# Patient Record
Sex: Male | Born: 1965 | Race: Black or African American | Hispanic: No | Marital: Single | Smoking: Former smoker
Health system: Southern US, Community
[De-identification: ages and names within clinical notes are randomized; demographics above are authoritative.]

## PROBLEM LIST (undated history)

## (undated) ENCOUNTER — Ambulatory Visit (HOSPITAL_COMMUNITY): Payer: No Typology Code available for payment source

## (undated) DIAGNOSIS — I10 Essential (primary) hypertension: Secondary | ICD-10-CM

---

## 2014-07-07 ENCOUNTER — Encounter: Payer: Self-pay | Admitting: *Deleted

## 2018-01-10 ENCOUNTER — Emergency Department (HOSPITAL_COMMUNITY): Payer: Self-pay

## 2018-01-10 ENCOUNTER — Encounter (HOSPITAL_COMMUNITY): Payer: Self-pay | Admitting: Emergency Medicine

## 2018-01-10 ENCOUNTER — Emergency Department (HOSPITAL_COMMUNITY)
Admission: EM | Admit: 2018-01-10 | Discharge: 2018-01-10 | Disposition: A | Discharge status: Home / Self Care | Payer: Self-pay | Attending: Emergency Medicine | Admitting: Emergency Medicine

## 2018-01-10 ENCOUNTER — Other Ambulatory Visit: Payer: Self-pay

## 2018-01-10 DIAGNOSIS — R0789 Other chest pain: Secondary | ICD-10-CM | POA: Insufficient documentation

## 2018-01-10 DIAGNOSIS — J918 Pleural effusion in other conditions classified elsewhere: Secondary | ICD-10-CM | POA: Insufficient documentation

## 2018-01-10 DIAGNOSIS — J90 Pleural effusion, not elsewhere classified: Secondary | ICD-10-CM

## 2018-01-10 DIAGNOSIS — Z87891 Personal history of nicotine dependence: Secondary | ICD-10-CM | POA: Insufficient documentation

## 2018-01-10 LAB — COMPREHENSIVE METABOLIC PANEL
ALK PHOS: 82 U/L (ref 38–126)
ALT: 18 U/L (ref 17–63)
AST: 20 U/L (ref 15–41)
Albumin: 4 g/dL (ref 3.5–5.0)
Anion gap: 13 (ref 5–15)
BILIRUBIN TOTAL: 1.3 mg/dL — AB (ref 0.3–1.2)
BUN: 11 mg/dL (ref 6–20)
CALCIUM: 9.2 mg/dL (ref 8.9–10.3)
CO2: 22 mmol/L (ref 22–32)
CREATININE: 1.03 mg/dL (ref 0.61–1.24)
Chloride: 100 mmol/L — ABNORMAL LOW (ref 101–111)
GFR calc non Af Amer: >60 mL/min (ref >60)
Glucose, Bld: 127 mg/dL — ABNORMAL HIGH (ref 65–99)
Potassium: 3.4 mmol/L — ABNORMAL LOW (ref 3.5–5.1)
Sodium: 135 mmol/L (ref 135–145)
TOTAL PROTEIN: 7.8 g/dL (ref 6.5–8.1)

## 2018-01-10 LAB — CBG MONITORING, ED: Glucose-Capillary: 124 mg/dL — ABNORMAL HIGH (ref 65–99)

## 2018-01-10 LAB — CBC WITH DIFFERENTIAL/PLATELET
BASOS PCT: 0 %
Basophils Absolute: 0 10*3/uL (ref 0.0–0.1)
EOS PCT: 1 %
Eosinophils Absolute: 0.2 10*3/uL (ref 0.0–0.7)
HCT: 44.5 % (ref 39.0–52.0)
Hemoglobin: 15.2 g/dL (ref 13.0–17.0)
LYMPHS ABS: 2.8 10*3/uL (ref 0.7–4.0)
Lymphocytes Relative: 17 %
MCH: 31.3 pg (ref 26.0–34.0)
MCHC: 34.2 g/dL (ref 30.0–36.0)
MCV: 91.6 fL (ref 78.0–100.0)
MONO ABS: 1.5 10*3/uL — AB (ref 0.1–1.0)
Monocytes Relative: 9 %
NEUTROS ABS: 11.8 10*3/uL — AB (ref 1.7–7.7)
NEUTROS PCT: 73 %
PLATELETS: 224 10*3/uL (ref 150–400)
RBC: 4.86 MIL/uL (ref 4.22–5.81)
RDW: 13.6 % (ref 11.5–15.5)
WBC: 16.3 10*3/uL — ABNORMAL HIGH (ref 4.0–10.5)

## 2018-01-10 LAB — TROPONIN I

## 2018-01-10 LAB — MAGNESIUM: MAGNESIUM: 1.9 mg/dL (ref 1.7–2.4)

## 2018-01-10 LAB — PROTIME-INR
INR: 1.08
Prothrombin Time: 13.9 seconds (ref 11.4–15.2)

## 2018-01-10 LAB — BRAIN NATRIURETIC PEPTIDE: B Natriuretic Peptide: 31.5 pg/mL (ref 0.0–100.0)

## 2018-01-10 MED ORDER — TRAMADOL HCL 50 MG PO TABS: 50.0000 mg | ORAL_TABLET | Freq: Four times a day (QID) | ORAL | 0 refills | Status: AC | PRN

## 2018-01-10 MED ORDER — KETOROLAC TROMETHAMINE 30 MG/ML IJ SOLN
30.0000 mg | Freq: Once | INTRAMUSCULAR | Status: AC
  Administered 2018-01-10: 30 mg via INTRAVENOUS
  Filled 2018-01-10: qty 1

## 2018-01-10 MED ORDER — MORPHINE SULFATE (PF) 4 MG/ML IV SOLN
4.0000 mg | Freq: Once | INTRAVENOUS | Status: DC
  Filled 2018-01-10: qty 1

## 2018-01-10 MED ORDER — PREDNISONE 20 MG PO TABS
60.0000 mg | ORAL_TABLET | ORAL | Status: AC
  Administered 2018-01-10: 60 mg via ORAL
  Filled 2018-01-10: qty 3

## 2018-01-10 MED ORDER — PREDNISONE 20 MG PO TABS: 40.0000 mg | ORAL_TABLET | Freq: Every day | ORAL | 0 refills | Status: AC

## 2018-01-10 MED ORDER — ASPIRIN 81 MG PO CHEW
324.0000 mg | CHEWABLE_TABLET | Freq: Once | ORAL | Status: AC
  Administered 2018-01-10: 324 mg via ORAL
  Filled 2018-01-10: qty 4

## 2018-01-10 NOTE — ED Notes (Signed)
ED Provider at bedside. 

## 2018-01-10 NOTE — ED Provider Notes (Signed)
[facility]   CSN: [id number] Arrival date & time: 01/10/18  [id number]     History   Chief Complaint Chief Complaint  Patient presents with  . Chest Pain    HPI Justin Mcpherson is a 52 y.o. male.  HPI Patient presents with concern of chest pain. Pain is left upper chest, left posterior thorax. Pain began 4 days ago, after he had an episode of nausea, vomiting. He recalls eating chicken that he has some concern about prior to the onset of nausea, but otherwise no recent changes in medication, diet, activity. Nausea has essentially resolved, but the pain is been persistent since onset, waxing, waning severity. It is not positional, exertional, pleuritic. No syncope, no fever, no chills. There is some left upper abdominal pain as well. Patient notes that his appetite is diminished, though he cannot specify if this is due to nausea or pain.  Patient denies history of substantial medical problems, has no history of cardiac disease, nor cardiac evaluation.  Patient is a former smoker.   Past medical history: Unremarkable  Past surgical history: None    Home Medications     Allergies   Patient has no known allergies.   Review of Systems Review of Systems  Constitutional:       Per HPI, otherwise negative  HENT:       Per HPI, otherwise negative  Respiratory:       Per HPI, otherwise negative  Cardiovascular:       Per HPI, otherwise negative  Gastrointestinal: Positive for abdominal pain, nausea and vomiting.  Endocrine:       Negative aside from HPI  Genitourinary:       Neg aside from HPI   Musculoskeletal:       Per HPI, otherwise negative  Skin: Negative.   Neurological: Negative for syncope.     Physical Exam Updated Vital Signs BP (!) 159/91   Pulse 60   Resp 19   SpO2 97%   Physical Exam  Constitutional: He is oriented to person, place, and time. He appears well-developed. No distress.  HENT:    Head: Normocephalic and atraumatic.  Eyes: Conjunctivae and EOM are normal.  Cardiovascular: Normal rate and regular rhythm.  Pulmonary/Chest: Effort normal. No stridor. No respiratory distress.  Mild tenderness to palpation left upper chest  Abdominal: He exhibits no distension.  Minimal discomfort with palpation about the epigastrium  Musculoskeletal: He exhibits no edema.  Neurological: He is alert and oriented to person, place, and time.  Skin: Skin is warm and dry.  Psychiatric: He has a normal mood and affect.  Nursing note and vitals reviewed.    ED Treatments / Results  Labs (all labs ordered are listed, but only abnormal results are displayed) Labs Reviewed  COMPREHENSIVE METABOLIC PANEL - Abnormal; Notable for the following components:      Result Value   Potassium 3.4 (*)    Chloride 100 (*)    Glucose, Bld 127 (*)    Total Bilirubin 1.3 (*)    All other components within normal limits  CBC WITH DIFFERENTIAL/PLATELET - Abnormal; Notable for the following components:   WBC 16.3 (*)    Neutro Abs 11.8 (*)    Monocytes Absolute 1.5 (*)    All other components within normal limits  CBG MONITORING, ED - Abnormal; Notable for the following components:   Glucose-Capillary 124 (*)    All other components within normal limits  MAGNESIUM  TROPONIN  I  BRAIN NATRIURETIC PEPTIDE  PROTIME-INR    EKG EKG Interpretation  Date/Time:  Tuesday January 10 2018 06:58:52 EDT Ventricular Rate:  87 PR Interval:    QRS Duration: 89 QT Interval:  368 QTC Calculation: 443 R Axis:   77 Text Interpretation:  Sinus rhythm Probable left atrial enlargement Probable left ventricular hypertrophy Abnormal T, consider ischemia, diffuse leads Confirmed by Nicanor Alcon, April ([id number]) on 01/10/2018 7:01:51 AM   Radiology Ct Chest Wo Contrast  Result Date: 01/10/2018 CLINICAL DATA:  Central chest pain and shortness of breath beginning 3 days ago after episode food poisoning with vomiting. EXAM: CT  CHEST WITHOUT CONTRAST TECHNIQUE: Multidetector CT imaging of the chest was performed following the standard protocol without IV contrast. COMPARISON:  Chest radiography same day. FINDINGS: Cardiovascular: Heart size upper limits of normal or mildly enlarged. Mild atherosclerotic calcification of the aortic arch. Minimal coronary artery calcification. No pericardial fluid. Mediastinum/Nodes: No visible mediastinal or hilar mass or lymphadenopathy. Normal appearing mediastinal nodes. Lungs/Pleura: Small left effusion layering dependently with mild dependent atelectasis. Upper Abdomen: Negative Musculoskeletal: Negative. Some degree of thyroid goiter. IMPRESSION: Small left effusion layering dependently with mild dependent pulmonary atelectasis. No mediastinal air or gas. Mild cardiomegaly. Mild aortic atherosclerosis. Minimal coronary artery calcification. Electronically Signed   By: Paulina Fusi M.D.   On: 01/10/2018 10:08   Dg Chest Portable 1 View  Result Date: 01/10/2018 CLINICAL DATA:  Chest pain beginning 3 days ago. Possible food poisoning with vomiting. EXAM: PORTABLE CHEST 1 VIEW COMPARISON:  None. FINDINGS: Heart size is normal. Mild tortuosity of the aorta. Mediastinal shadows otherwise normal. No mediastinal air. The lungs are clear. The vascularity is normal. No effusions. No abnormalities. IMPRESSION: No active disease. No sign of pneumomediastinum or other acute chest pathology. Electronically Signed   By: Paulina Fusi M.D.   On: 01/10/2018 07:27    Procedures Procedures (including critical care time)  Medications Ordered in ED Medications  morphine 4 MG/ML injection 4 mg (has no administration in time range)  aspirin chewable tablet 324 mg (324 mg Oral Given 01/10/18 0729)  ketorolac (TORADOL) 30 MG/ML injection 30 mg (30 mg Intravenous Given 01/10/18 0825)     Initial Impression / Assessment and Plan / ED Course  I have reviewed the triage vital signs and the nursing notes.  Pertinent  labs & imaging results that were available during my care of the patient were reviewed by me and considered in my medical decision making (see chart for details).   Repeat exam patient continues to complain of pain. Initial x-ray reassuring, labs reassuring  10:53 AM I demonstrated the CT images to the patient and we discussed the left-sided pleural effusion. We discussed possibilities for this, including inflammation, prior infection, malignancy. I offered admission for further evaluation and management versus close outpatient follow-up with pulmonology. Patient had a strong preference for this latter. With no evidence for ACS, no evidence for PE, with no hypoxia, tachypnea, low risk profile, no evidence for pneumonia, bacteremia, sepsis, the patient is appropriate for close outpatient follow-up.  Final Clinical Impressions(s) / ED Diagnoses  Atypical chest pain Pleural effusion, left   Gerhard Munch, MD 01/10/18 1054

## 2018-01-10 NOTE — ED Notes (Signed)
Patient transported to CT 

## 2018-01-10 NOTE — ED Triage Notes (Signed)
Pt reports chest pain that started on Saturday after eating chicken he thinks made him sick. Pt reports not being able to sleep last two days d/t pain. Pt endorses vomiting Saturday night as well.

## 2018-10-09 IMAGING — CT CT CHEST W/O CM
2 of 3 series · 15 of 36 positions shown, 18 images · non-contrast
Comparison: Chest radiography same day.

CLINICAL DATA: Central chest pain and shortness of breath beginning
3 days ago after episode food poisoning with vomiting.

EXAM:
CT CHEST WITHOUT CONTRAST
TECHNIQUE: Multidetector CT imaging of the chest was performed following the
standard protocol without IV contrast.

[Series 4: thorax 2.0 · axial · 0.82mm/px · z∈[+1024,+1344]mm · 12 of 188 slices shown, 15 images]
[im 14/188  mediastinal]
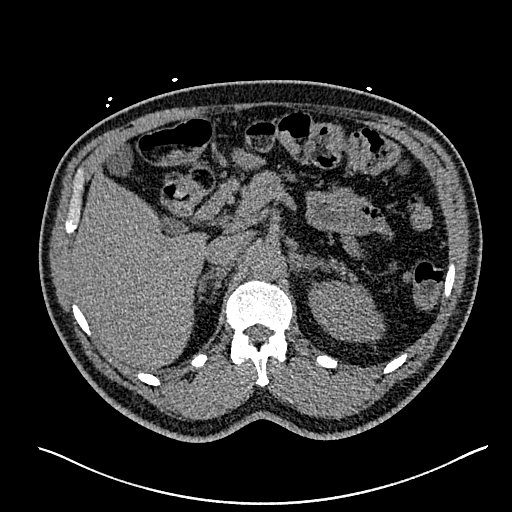
[im 14/188  lung]
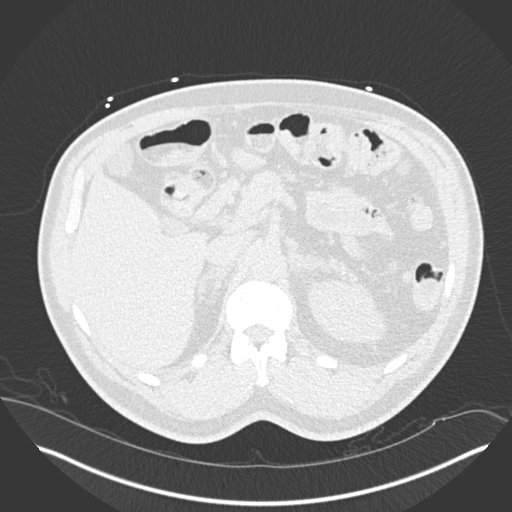
[im 28/188  lung]
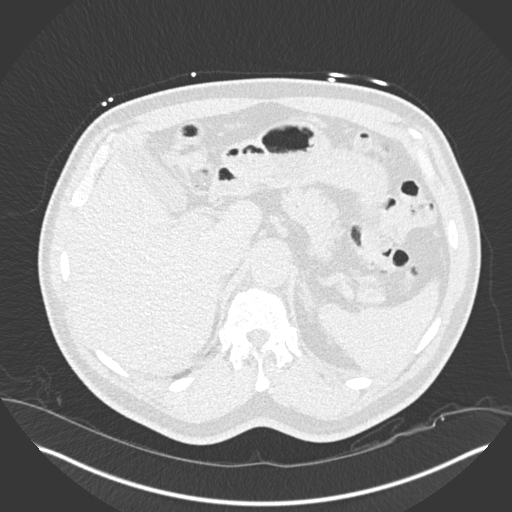
[im 42/188  lung]
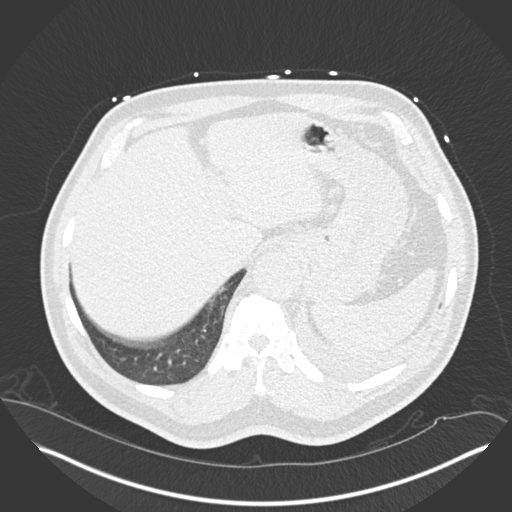
[im 56/188  lung]
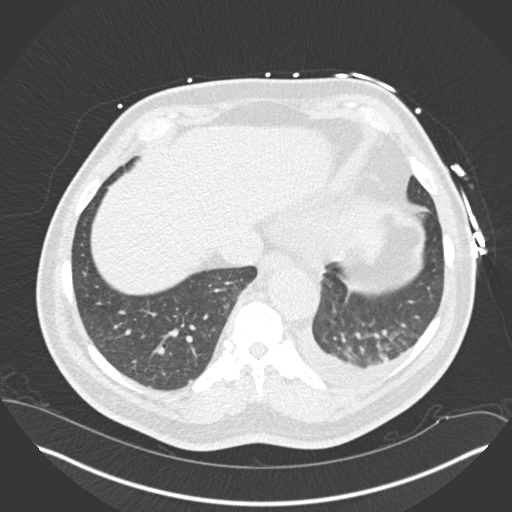
[im 70/188  mediastinal]
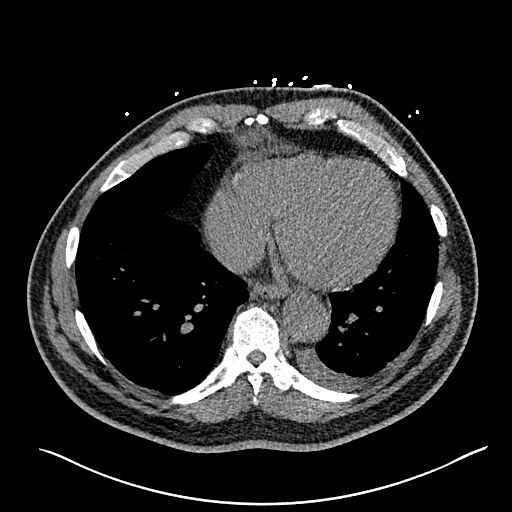
[im 70/188  lung]
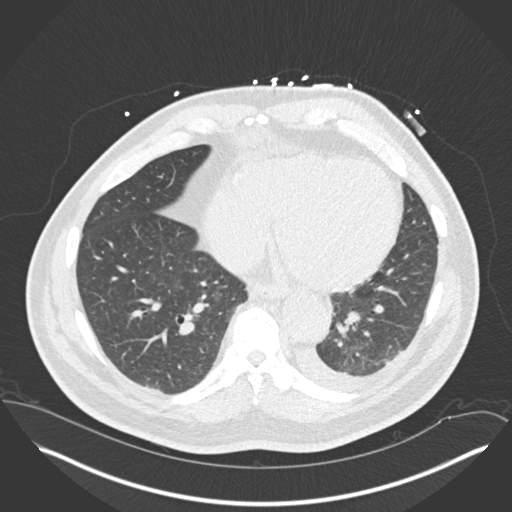
[im 84/188  lung]
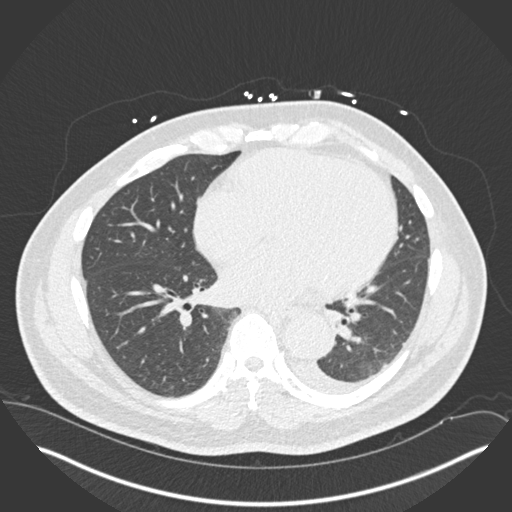
[im 104/188  lung]
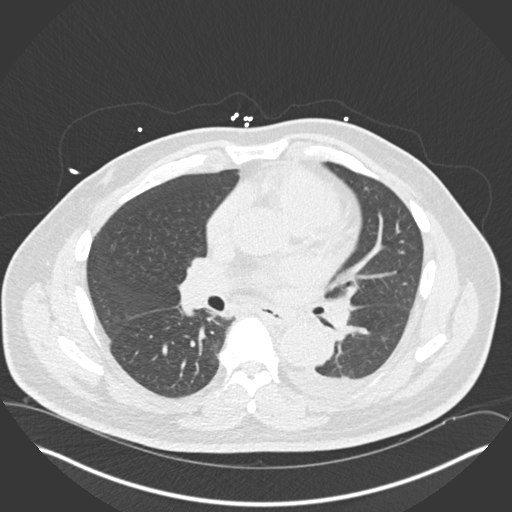
[im 118/188  lung]
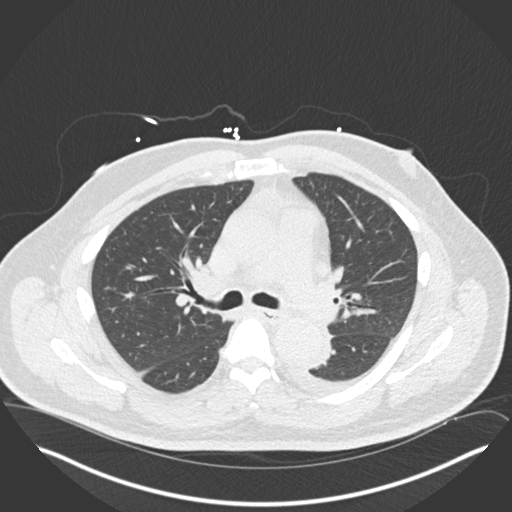
[im 132/188  mediastinal]
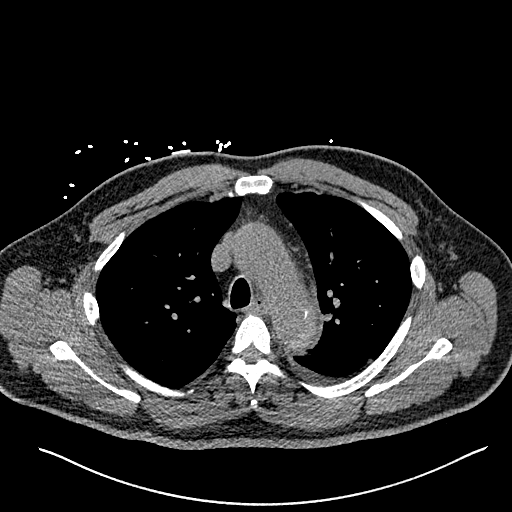
[im 132/188  lung]
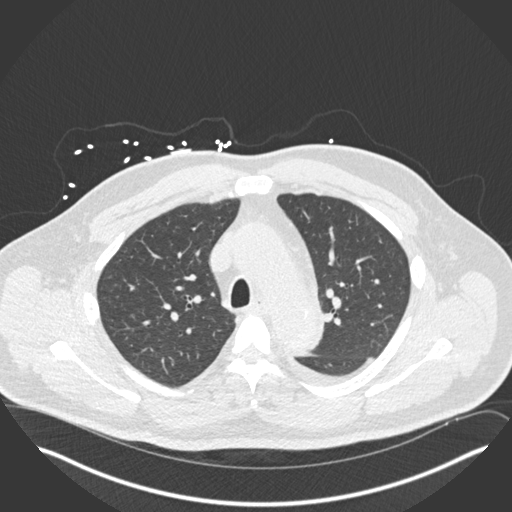
[im 146/188  lung]
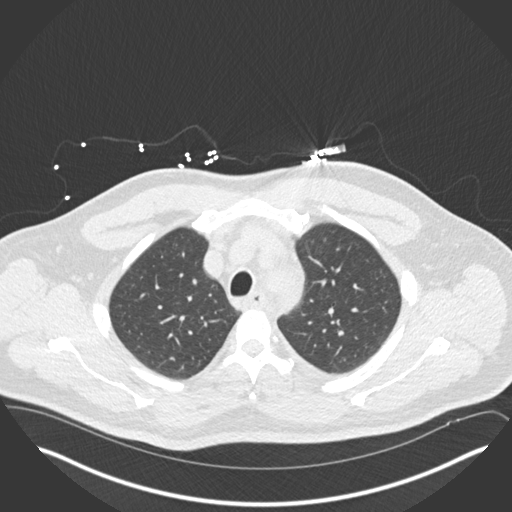
[im 160/188  lung]
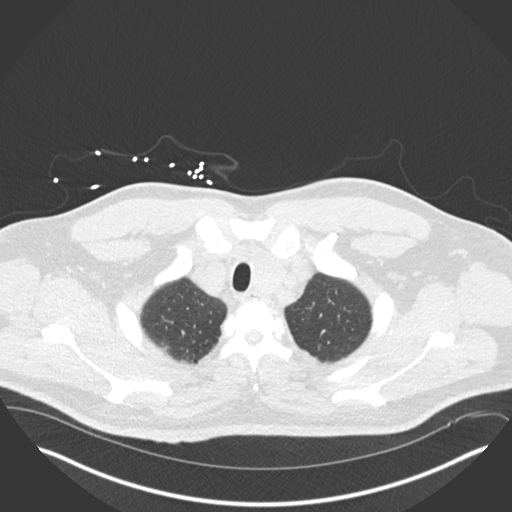
[im 174/188  lung]
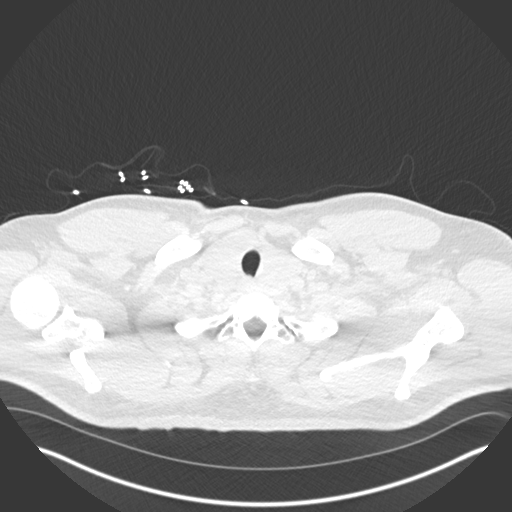

[Series 6: coronal · coronal · 0.69mm/px · 3 of 101 slices shown]
[im 21/101  lung]
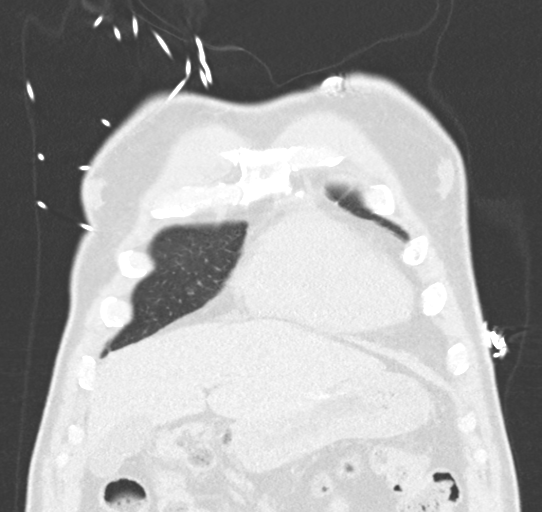
[im 41/101  lung]
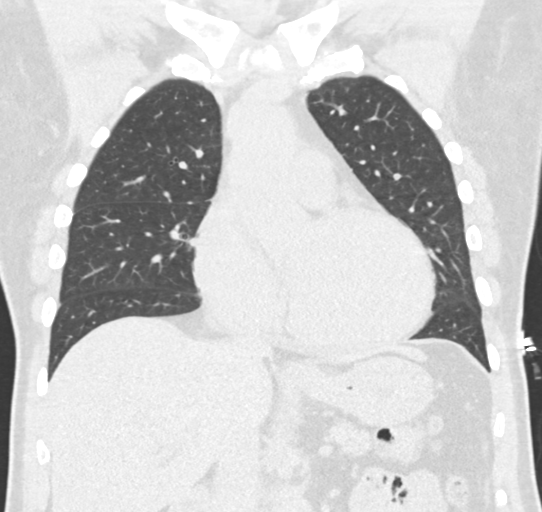
[im 61/101  lung]
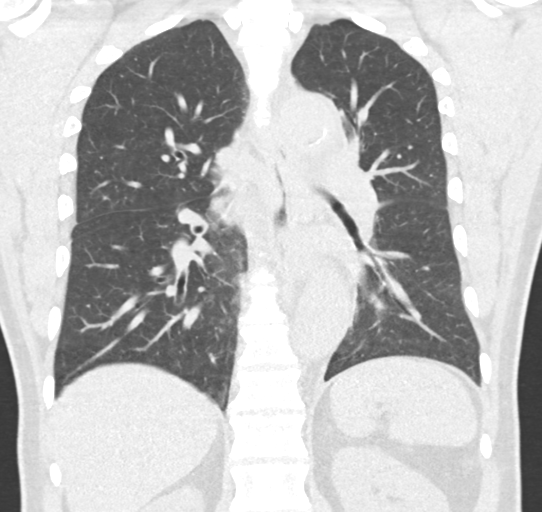

[15 of 36 positions shown; findings below may reference images not displayed]

FINDINGS: Cardiovascular: Heart size upper limits of normal or mildly
enlarged. Mild atherosclerotic calcification of the aortic arch.
Minimal coronary artery calcification. No pericardial fluid.

Mediastinum/Nodes: No visible mediastinal or hilar mass or
lymphadenopathy. Normal appearing mediastinal nodes.

Lungs/Pleura: Small left effusion layering dependently with mild
dependent atelectasis.

Upper Abdomen: Negative

Musculoskeletal: Negative.

Some degree of thyroid goiter.
IMPRESSION: Small left effusion layering dependently with mild dependent
pulmonary atelectasis.

No mediastinal air or gas.

Mild cardiomegaly. Mild aortic atherosclerosis. Minimal coronary
artery calcification.

## 2018-10-09 IMAGING — DX DG CHEST 1V PORT
1 series · 1 of 1 positions shown · non-contrast
Comparison: None.

CLINICAL DATA: Chest pain beginning 3 days ago. Possible food
poisoning with vomiting.

EXAM:
PORTABLE CHEST 1 VIEW

[chest]
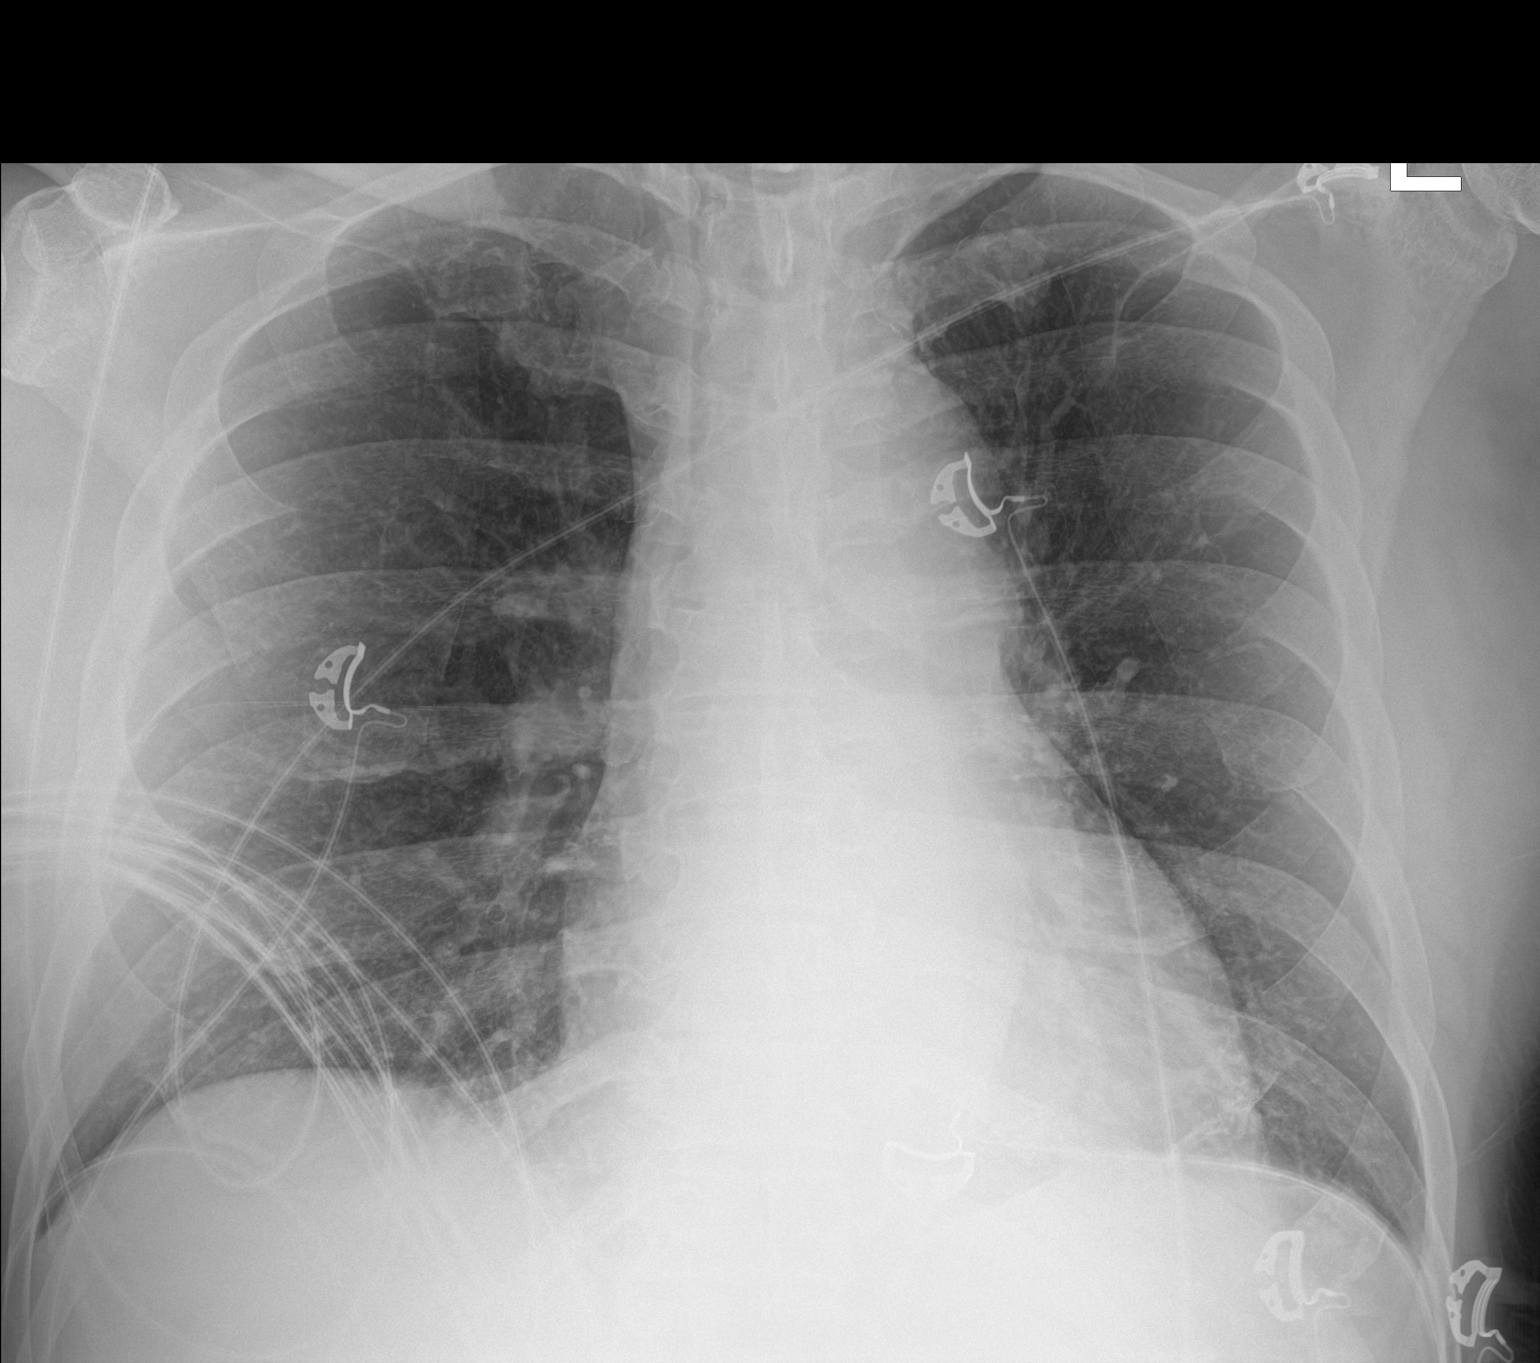

[1 of 1 positions shown; findings below may reference images not displayed]

FINDINGS: Heart size is normal. Mild tortuosity of the aorta. Mediastinal
shadows otherwise normal. No mediastinal air. The lungs are clear.
The vascularity is normal. No effusions. No abnormalities.
IMPRESSION: No active disease. No sign of pneumomediastinum or other acute chest
pathology.

## 2022-11-04 ENCOUNTER — Emergency Department (HOSPITAL_COMMUNITY): Payer: Medicaid Other

## 2022-11-04 ENCOUNTER — Emergency Department (HOSPITAL_COMMUNITY)
Admission: EM | Admit: 2022-11-04 | Discharge: 2022-11-04 | Disposition: A | Discharge status: Home / Self Care | Payer: Medicaid Other | Attending: Emergency Medicine | Admitting: Emergency Medicine

## 2022-11-04 ENCOUNTER — Encounter (HOSPITAL_COMMUNITY): Payer: Self-pay

## 2022-11-04 ENCOUNTER — Other Ambulatory Visit: Payer: Self-pay

## 2022-11-04 DIAGNOSIS — S7002XA Contusion of left hip, initial encounter: Secondary | ICD-10-CM | POA: Insufficient documentation

## 2022-11-04 DIAGNOSIS — Y92410 Unspecified street and highway as the place of occurrence of the external cause: Secondary | ICD-10-CM | POA: Diagnosis not present

## 2022-11-04 DIAGNOSIS — Z87891 Personal history of nicotine dependence: Secondary | ICD-10-CM | POA: Insufficient documentation

## 2022-11-04 DIAGNOSIS — S79912A Unspecified injury of left hip, initial encounter: Secondary | ICD-10-CM | POA: Diagnosis present

## 2022-11-04 DIAGNOSIS — I10 Essential (primary) hypertension: Secondary | ICD-10-CM | POA: Diagnosis not present

## 2022-11-04 HISTORY — DX: Essential (primary) hypertension: I10

## 2022-11-04 LAB — CBC
HCT: 46.4 % (ref 39.0–52.0)
Hemoglobin: 14.6 g/dL (ref 13.0–17.0)
MCH: 29.8 pg (ref 26.0–34.0)
MCHC: 31.5 g/dL (ref 30.0–36.0)
MCV: 94.7 fL (ref 80.0–100.0)
Platelets: 255 10*3/uL (ref 150–400)
RBC: 4.9 MIL/uL (ref 4.22–5.81)
RDW: 13.2 % (ref 11.5–15.5)
WBC: 7.3 10*3/uL (ref 4.0–10.5)
nRBC: 0 % (ref 0.0–0.2)

## 2022-11-04 LAB — SAMPLE TO BLOOD BANK

## 2022-11-04 LAB — I-STAT CHEM 8, ED
BUN: 11 mg/dL (ref 6–20)
Calcium, Ion: 1.18 mmol/L (ref 1.15–1.40)
Chloride: 105 mmol/L (ref 98–111)
Creatinine, Ser: 0.7 mg/dL (ref 0.61–1.24)
Glucose, Bld: 89 mg/dL (ref 70–99)
HCT: 45 % (ref 39.0–52.0)
Hemoglobin: 15.3 g/dL (ref 13.0–17.0)
Potassium: 3.8 mmol/L (ref 3.5–5.1)
Sodium: 140 mmol/L (ref 135–145)
TCO2: 23 mmol/L (ref 22–32)

## 2022-11-04 LAB — COMPREHENSIVE METABOLIC PANEL
ALT: 23 U/L (ref 0–44)
AST: 21 U/L (ref 15–41)
Albumin: 4.1 g/dL (ref 3.5–5.0)
Alkaline Phosphatase: 73 U/L (ref 38–126)
Anion gap: 11 (ref 5–15)
BUN: 9 mg/dL (ref 6–20)
CO2: 22 mmol/L (ref 22–32)
Calcium: 9.3 mg/dL (ref 8.9–10.3)
Chloride: 103 mmol/L (ref 98–111)
Creatinine, Ser: 0.81 mg/dL (ref 0.61–1.24)
GFR, Estimated: >60 mL/min (ref >60)
Glucose, Bld: 97 mg/dL (ref 70–99)
Potassium: 3.8 mmol/L (ref 3.5–5.1)
Sodium: 136 mmol/L (ref 135–145)
Total Bilirubin: 0.6 mg/dL (ref 0.3–1.2)
Total Protein: 7.4 g/dL (ref 6.5–8.1)

## 2022-11-04 LAB — URINALYSIS, ROUTINE W REFLEX MICROSCOPIC
Bilirubin Urine: NEGATIVE
Glucose, UA: NEGATIVE mg/dL
Hgb urine dipstick: NEGATIVE
Ketones, ur: NEGATIVE mg/dL
Leukocytes,Ua: NEGATIVE
Nitrite: NEGATIVE
Protein, ur: NEGATIVE mg/dL
Specific Gravity, Urine: 1.013 (ref 1.005–1.030)
pH: 7 (ref 5.0–8.0)

## 2022-11-04 LAB — ETHANOL: Alcohol, Ethyl (B): <10 mg/dL (ref <10)

## 2022-11-04 LAB — PROTIME-INR
INR: 1 (ref 0.8–1.2)
Prothrombin Time: 13.4 seconds (ref 11.4–15.2)

## 2022-11-04 LAB — LACTIC ACID, PLASMA: Lactic Acid, Venous: 1.2 mmol/L (ref 0.5–1.9)

## 2022-11-04 MED ORDER — IBUPROFEN 600 MG PO TABS: 600.0000 mg | ORAL_TABLET | Freq: Four times a day (QID) | ORAL | 0 refills | Status: AC | PRN

## 2022-11-04 MED ORDER — KETOROLAC TROMETHAMINE 15 MG/ML IJ SOLN
15.0000 mg | Freq: Once | INTRAMUSCULAR | Status: AC
  Administered 2022-11-04: 15 mg via INTRAVENOUS
  Filled 2022-11-04: qty 1

## 2022-11-04 MED ORDER — OXYCODONE-ACETAMINOPHEN 5-325 MG PO TABS
1.0000 | ORAL_TABLET | Freq: Once | ORAL | Status: AC
  Administered 2022-11-04: 1 via ORAL
  Filled 2022-11-04: qty 1

## 2022-11-04 MED ORDER — FENTANYL CITRATE PF 50 MCG/ML IJ SOSY
75.0000 ug | PREFILLED_SYRINGE | Freq: Once | INTRAMUSCULAR | Status: AC
  Administered 2022-11-04: 75 ug via INTRAVENOUS
  Filled 2022-11-04: qty 2
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
## 2022-11-04 NOTE — ED Notes (Signed)
Pt was able to ambulate with a slow but steady gait. Pt feels some stiffness in his leg but is able to bear weight
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
## 2024-02-14 NOTE — Progress Notes (Signed)
 History of Present Illness: Today I saw Justin Mcpherson in follow up of hypothyroidism and vitamin D deficiency. Labs on 08/10/23 found positive thyroid autoantibodies consistent with Hashimoto's thyroiditis.  TSH was still mildly elevated at 6.820 with low-normal Free T4 0.89.  I started Audley on Levothyroxine 50mcg daily.  Repeat labs in January showed normal TSH at 2.560.  However, he lost his prescription last month while moving and was unable to get an early refill from his pharmacy.  He hasn't noted much change off of medication. 25-D was up to 32.6 and PTH normal at 38 on 08/10/23 labs.  Emillio stopped Vitamin D2 50,000IU Qweek.  Repeat 25-D was down to 22.7 in January.  I recommended starting OTC Vitamin D3 2,000IU daily which he reports good compliance with.  No past medical history on file. No past surgical history on file. No Known Allergies Current Outpatient Medications on File Prior to Visit  Medication Sig Dispense Refill  . Cholecalciferol (VITAMIN D3) 50 MCG (2000 UT) TABS Take one tablet (50 mcg dose) by mouth daily. 30 tablet   . levothyroxine sodium (SYNTHROID) 50 mcg tablet Take one tablet (50 mcg dose) by mouth daily. 90 tablet 3  . losartan-hydrochlorothiazide (HYZAAR) 100-25 MG per tablet Take one tablet by mouth daily.     No current facility-administered medications on file prior to visit.   No family history on file. Social History   Socioeconomic History  . Marital status: Single  Tobacco Use  . Smoking status: Never  . Smokeless tobacco: Never    Review of Systems Eight systems were reviewed; pertinent positives and negatives are as mentioned in the HPI.    Physical Exam: Vitals:   02/14/24 0813  BP: 140/74  Weight: 292 lb (132.5 kg)  Height: 6' 4 (1.93 m)   Body mass index is 35.54 kg/m.  GENERAL: Pleasant, well-appearing,  male in no distress.  HEENT: Pupils equal and round. Extraocular movements intact.  No lid lag or proptosis.  Mucous membranes  moist with no oropharyngeal lesions.  NECK: Supple with no lymphadenopathy. Thyroid is enlarged.  CARDIOVASCULAR: Normal rate and regular rhythm with no murmurs, rubs, or gallops. PULMONARY: Clear to auscultation bilaterally with normal respiratory effort.  GASTROINTESTINAL: Soft, nontender, and nondistended. Normoactive bowel sounds.  EXTREMITIES:  Warm and well-perfused.  No clubbing, cyanosis, or edema. No noted tremor. NEUROLOGIC: Gait normal.  Deep tendon reflexes 2+ and symmetric.  SKIN: Warm and dry with no rashes or lesions.  PSYCH: Alert and oriented x 3.  Mood is normal.  A/P:   1.  Hashimoto's Thyroiditis:  Labs on 08/10/23 found positive thyroid autoantibodies consistent with Hashimoto's thyroiditis.  TSH was still mildly elevated at 6.820 with low-normal Free T4 0.89.  I started [name] on Levothyroxine 50mcg daily.  Repeat labs in January showed normal TSH at 2.560.  However, he lost his prescription last month while moving and was unable to get an early refill from his pharmacy.  He hasn't noted much change off of medication.  I will check a TSH level today.  [name] will resume Levothyroxine 50mcg daily if he is hypothyroid again.  Blood work will be repeated in 3 months and/or 6 months depending on his recommendations.  2.  Vitamin D Deficiency:  25-D was up to 32.6 and PTH normal at 38 on 08/10/23 labs.  [name] stopped Vitamin D2 50,000IU Qweek.  Repeat 25-D was down to 22.7 in January.  I recommended starting OTC Vitamin D3 2,000IU daily which he reports  good compliance with.  I will check labs including 25-D today and repeat in 6 months if stable.  Follow-up in 6 months.
